# Patient Record
Sex: Female | Born: 1985 | Race: Black or African American | Hispanic: No | Marital: Single | Smoking: Current every day smoker
Health system: Southern US, Community
[De-identification: ages and names within clinical notes are randomized; demographics above are authoritative.]

---
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
## 2008-06-13 ENCOUNTER — Inpatient Hospital Stay (HOSPITAL_COMMUNITY): Admission: AD | Admit: 2008-06-13 | Discharge: 2008-06-13 | Payer: Self-pay | Admitting: Family Medicine

## 2010-02-03 ENCOUNTER — Emergency Department (HOSPITAL_COMMUNITY): Admission: EM | Admit: 2010-02-03 | Discharge: 2010-02-03 | Payer: Self-pay | Admitting: Emergency Medicine

## 2011-04-01 ENCOUNTER — Emergency Department (HOSPITAL_COMMUNITY)
Admission: EM | Admit: 2011-04-01 | Discharge: 2011-04-01 | Disposition: A | Discharge status: Home / Self Care | Payer: Self-pay | Attending: Emergency Medicine | Admitting: Emergency Medicine

## 2011-04-01 DIAGNOSIS — R079 Chest pain, unspecified: Secondary | ICD-10-CM | POA: Insufficient documentation

## 2011-04-01 DIAGNOSIS — R071 Chest pain on breathing: Secondary | ICD-10-CM | POA: Insufficient documentation

## 2013-10-10 ENCOUNTER — Emergency Department (HOSPITAL_COMMUNITY): Payer: BC Managed Care – PPO

## 2013-10-10 ENCOUNTER — Encounter (HOSPITAL_COMMUNITY): Payer: Self-pay | Admitting: Emergency Medicine

## 2013-10-10 ENCOUNTER — Emergency Department (HOSPITAL_COMMUNITY)
Admission: EM | Admit: 2013-10-10 | Discharge: 2013-10-10 | Disposition: A | Discharge status: Home / Self Care | Payer: BC Managed Care – PPO | Attending: Emergency Medicine | Admitting: Emergency Medicine

## 2013-10-10 DIAGNOSIS — Y929 Unspecified place or not applicable: Secondary | ICD-10-CM | POA: Insufficient documentation

## 2013-10-10 DIAGNOSIS — Y9389 Activity, other specified: Secondary | ICD-10-CM | POA: Insufficient documentation

## 2013-10-10 DIAGNOSIS — S93409A Sprain of unspecified ligament of unspecified ankle, initial encounter: Secondary | ICD-10-CM | POA: Diagnosis not present

## 2013-10-10 DIAGNOSIS — S8990XA Unspecified injury of unspecified lower leg, initial encounter: Secondary | ICD-10-CM | POA: Diagnosis present

## 2013-10-10 DIAGNOSIS — X500XXA Overexertion from strenuous movement or load, initial encounter: Secondary | ICD-10-CM | POA: Diagnosis not present

## 2013-10-10 DIAGNOSIS — S93609A Unspecified sprain of unspecified foot, initial encounter: Secondary | ICD-10-CM | POA: Insufficient documentation

## 2013-10-10 DIAGNOSIS — S93601A Unspecified sprain of right foot, initial encounter: Secondary | ICD-10-CM

## 2013-10-10 DIAGNOSIS — F17200 Nicotine dependence, unspecified, uncomplicated: Secondary | ICD-10-CM | POA: Insufficient documentation

## 2013-10-10 MED ORDER — NAPROXEN 500 MG PO TABS: 500.0000 mg | ORAL_TABLET | Freq: Two times a day (BID) | ORAL | Status: AC

## 2013-10-10 MED ORDER — HYDROCODONE-ACETAMINOPHEN 5-325 MG PO TABS
1.0000 | ORAL_TABLET | Freq: Once | ORAL | Status: AC
  Administered 2013-10-10: 1 via ORAL
  Filled 2013-10-10: qty 1

## 2013-10-10 NOTE — ED Notes (Signed)
Pt reports missed a step last night and turned r ankle.  C/O pain to r ankle.

## 2013-10-10 NOTE — ED Provider Notes (Signed)
CSN: [id number]     Arrival date & time 10/10/13  1222 History   First MD Initiated Contact with Patient 10/10/13 1230     Chief Complaint  Patient presents with  . Ankle Pain   (Consider location/radiation/quality/duration/timing/severity/associated sxs/prior Treatment) Patient is a 27 y.o. female presenting with ankle pain. The history is provided by the patient.  Ankle Pain Location:  Ankle Time since incident:  12 hours Injury: yes   Mechanism of injury: fall   Fall:    Fall occurred:  Down stairs   Impact surface:  Stairs Ankle location:  R ankle Pain details:    Quality:  Shooting and aching Associated symptoms: decreased ROM and swelling    Anita Shaffer is a 27 y.o. female who presents to the ED with right ankle pain. She was going down the steps last night and missed a step and turned her right ankle. Complains of pain and some swelling lateral aspect.   History reviewed. No pertinent past medical history. History reviewed. No pertinent past surgical history. No family history on file. History  Substance Use Topics  . Smoking status: Current Every Day Smoker  . Smokeless tobacco: Not on file  . Alcohol Use: No   OB History   Grav Para Term Preterm Abortions TAB SAB Ect Mult Living                 Review of Systems Negative except as stated in HPI  Allergies  Review of patient's allergies indicates no known allergies.  Home Medications  No current outpatient prescriptions on file. BP 136/88  Pulse 89  Temp(Src) 98.8 F (37.1 C) (Oral)  Resp 18  Ht 5\' 6"  (1.676 m)  Wt 190 lb (86.183 kg)  BMI 30.68 kg/m2  SpO2 100%  LMP 09/13/2013 Physical Exam  Nursing note and vitals reviewed. Constitutional: She is oriented to person, place, and time. She appears well-developed and well-nourished. No distress.  HENT:  Head: Normocephalic.  Eyes: Conjunctivae and EOM are normal.  Neck: Neck supple.  Cardiovascular: Normal rate.   Pulmonary/Chest: Effort  normal.  Musculoskeletal:       Right ankle: She exhibits normal range of motion, no deformity, no laceration and normal pulse. Swelling: minimal. Tenderness. Lateral malleolus tenderness found. Achilles tendon normal.       Feet:  Pedal pulse strong, adequate circulation, good touch sensation. Pain radiates for the ankle to the lateral aspect of the foot.   Neurological: She is alert and oriented to person, place, and time. No cranial nerve deficit.  Skin: Skin is warm and dry.  Psychiatric: She has a normal mood and affect. Her behavior is normal.   Dg Ankle Complete Right  10/10/2013   CLINICAL DATA:  Fall last night.  Right ankle pain.  EXAM: RIGHT ANKLE - COMPLETE 3+ VIEW  COMPARISON:  None.  FINDINGS: There is no evidence of fracture, dislocation, or joint effusion. There is no evidence of arthropathy or other focal bone abnormality. Soft tissues are unremarkable.  IMPRESSION: Negative.   Electronically Signed   By: Amie Portland M.D.   On: 10/10/2013 12:57   Dg Foot Complete Right  10/10/2013   CLINICAL DATA:  Proximal foot pain.  EXAM: RIGHT FOOT COMPLETE - 3+ VIEW  COMPARISON:  None.  FINDINGS: There is no evidence of fracture or dislocation. There is no evidence of arthropathy or other focal bone abnormality. Soft tissues are unremarkable.  IMPRESSION: No acute osseous injury of the right foot.  Electronically Signed   By: Elige Ko   On: 10/10/2013 12:57    ED Course  Procedures  MDM  27 y.o. female with sprain to the right foot and ankle. Stable for discharge. She remains neurovascularly intact.  Will place in [facility] and she will follow up as needed.  Discussed with the patient and all questioned fully answered.    Medication List         naproxen 500 MG tablet  Commonly known as:  NAPROSYN  Take 1 tablet (500 mg total) by mouth 2 (two) times daily with a meal.          Janne Napoleon, NP 10/10/13 1648

## 2013-10-10 NOTE — ED Notes (Signed)
Pt c/o right ankle pain and swelling since last night. Pt states she rolled her ankle while walking down steps. Limited movement due to pain.

## 2013-10-11 NOTE — ED Provider Notes (Signed)
Medical screening examination/treatment/procedure(s) were performed by non-physician practitioner and as supervising physician I was immediately available for consultation/collaboration.  EKG Interpretation   None         Nagi Furio W Cameka Rae, MD 10/11/13 0712 

## 2015-01-26 IMAGING — CR DG FOOT COMPLETE 3+V*R*
3 series · 3 of 3 positions shown · non-contrast
Comparison: None.

CLINICAL DATA: Proximal foot pain.

EXAM:
RIGHT FOOT COMPLETE - 3+ VIEW

[view not recorded (1 of 3)]
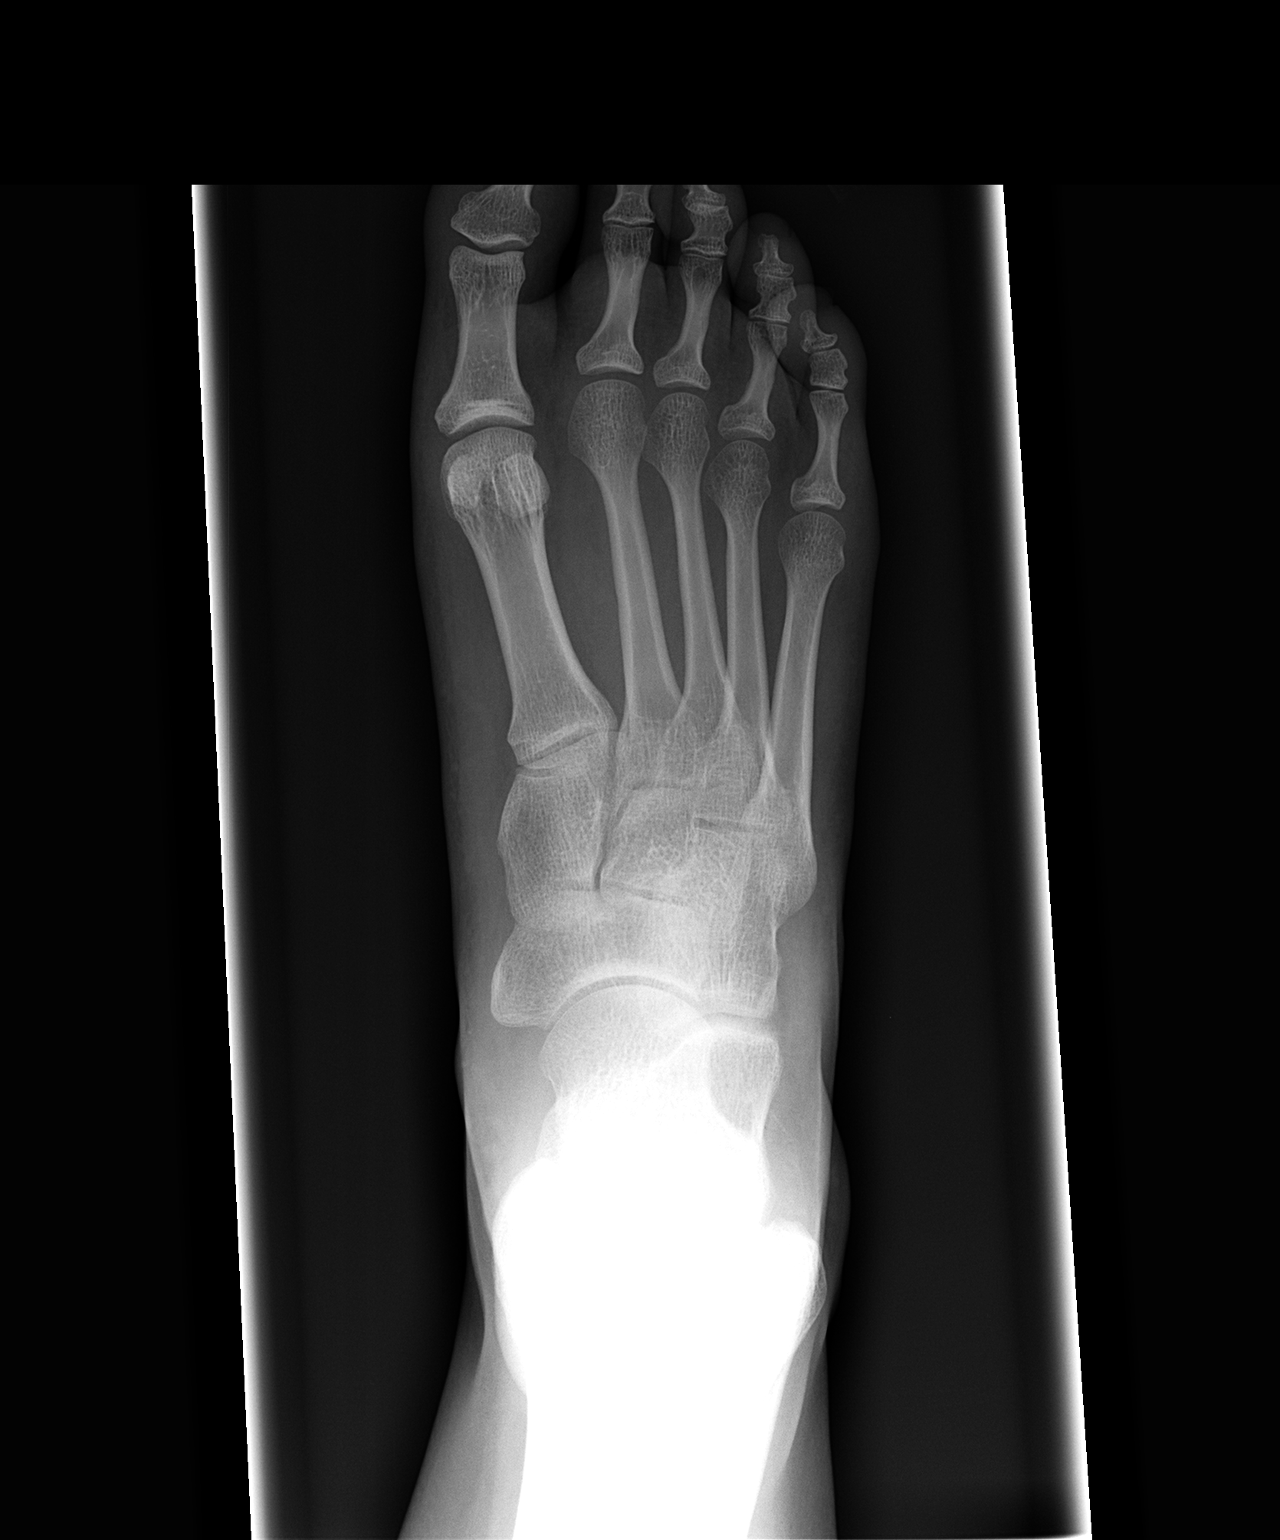

[view not recorded (2 of 3)]
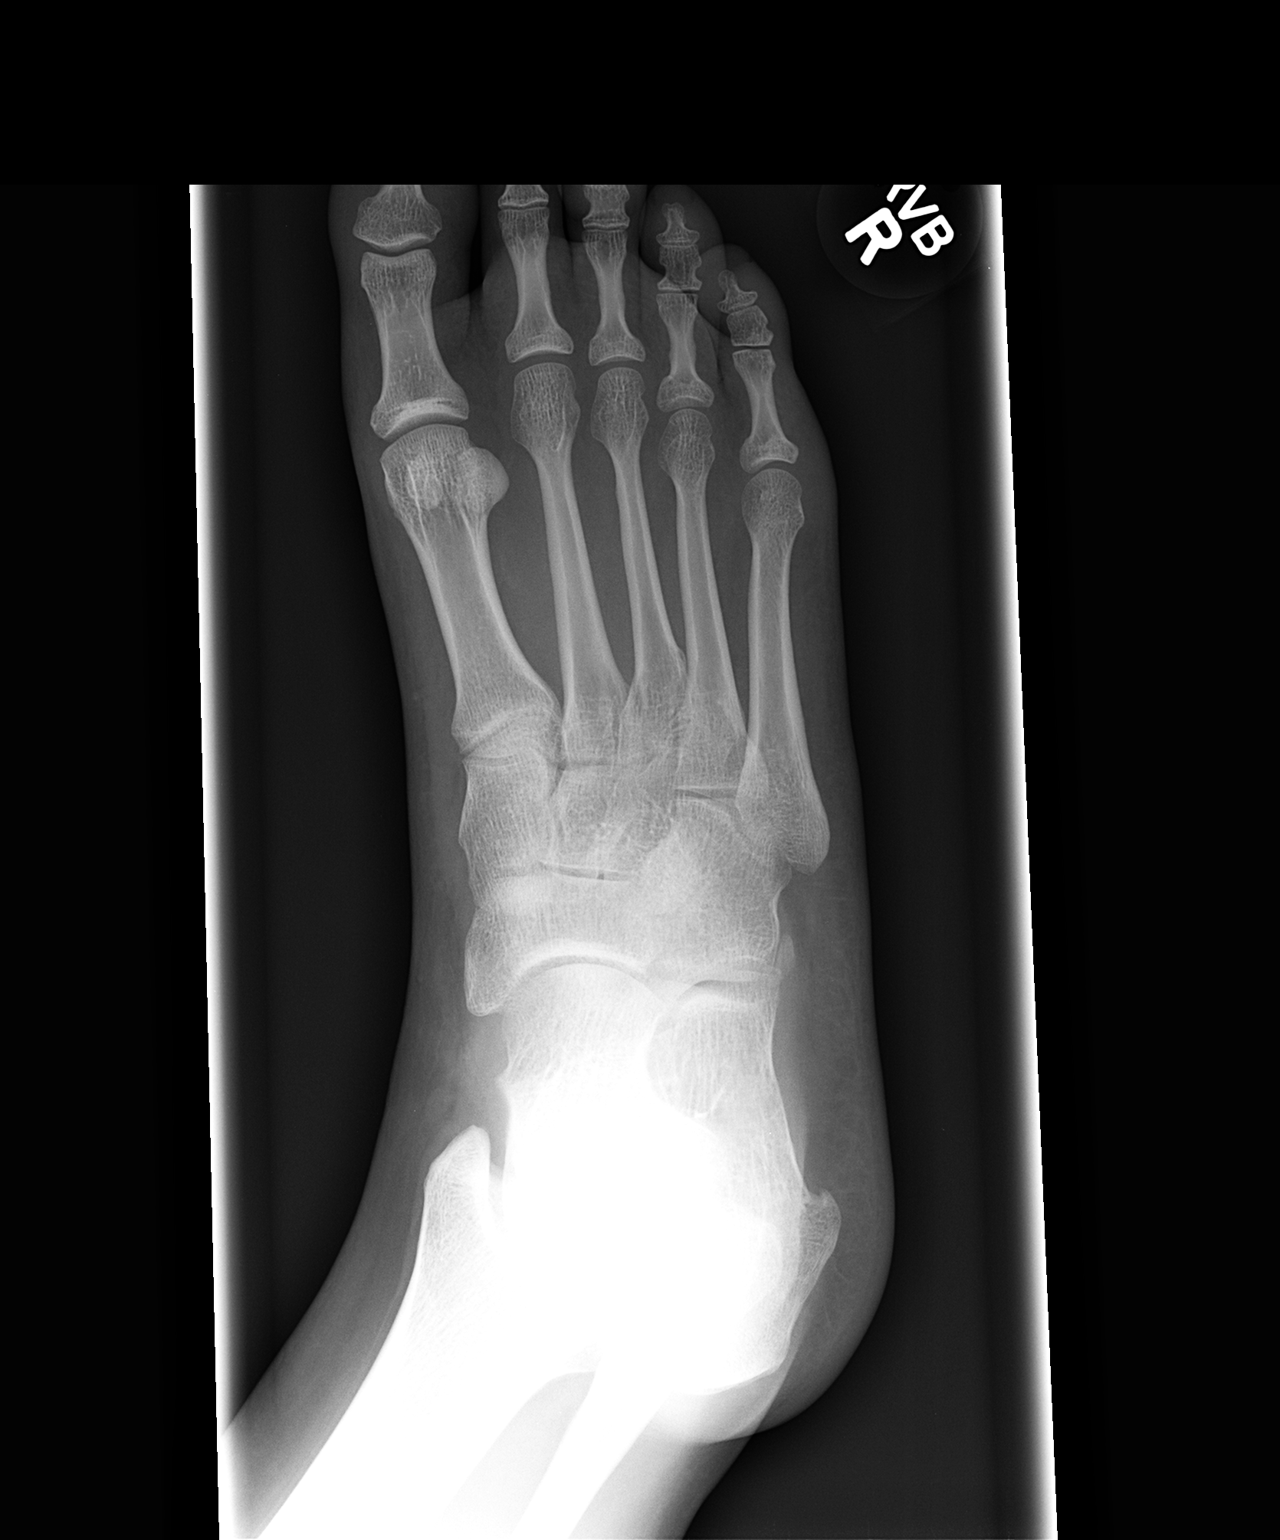

[view not recorded (3 of 3)]
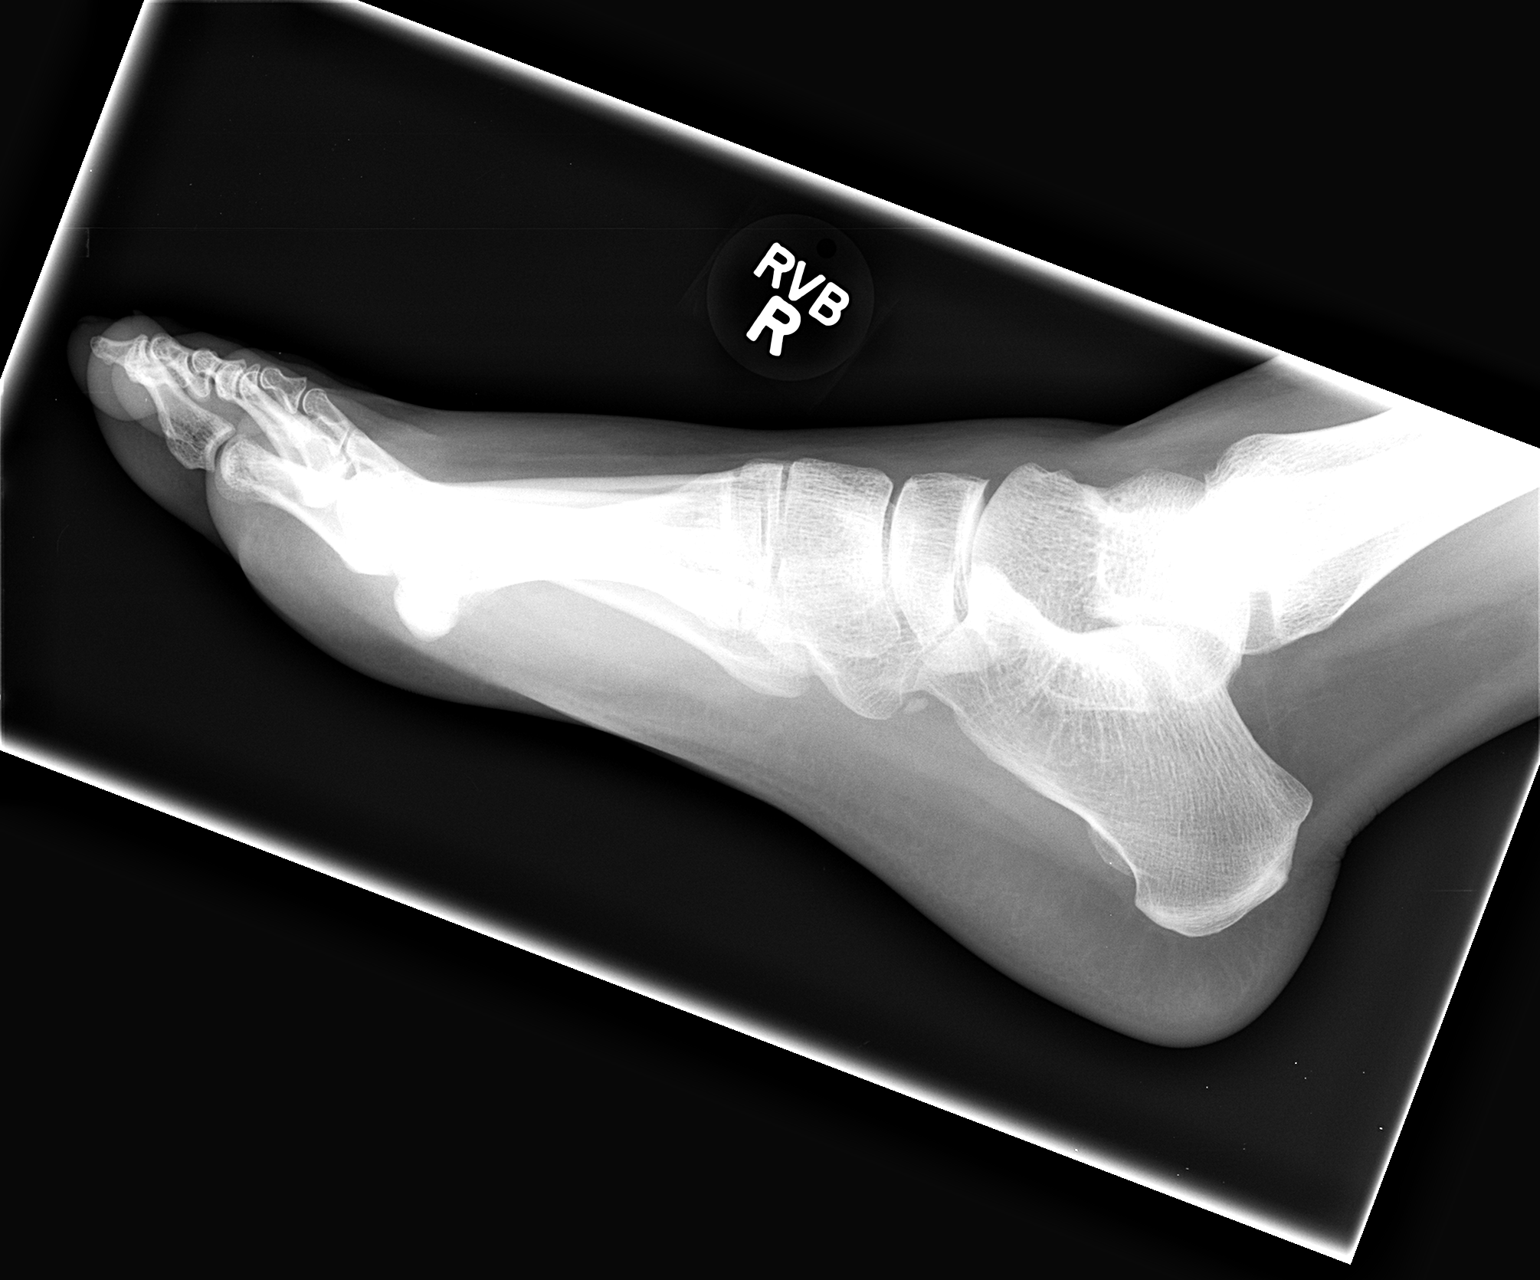

[3 of 3 positions shown; findings below may reference images not displayed]

FINDINGS: There is no evidence of fracture or dislocation. There is no
evidence of arthropathy or other focal bone abnormality. Soft
tissues are unremarkable.
IMPRESSION: No acute osseous injury of the right foot.

## 2015-11-18 ENCOUNTER — Emergency Department (HOSPITAL_COMMUNITY)
Admission: EM | Admit: 2015-11-18 | Discharge: 2015-11-18 | Disposition: A | Discharge status: Home / Self Care | Payer: BLUE CROSS/BLUE SHIELD | Attending: Emergency Medicine | Admitting: Emergency Medicine

## 2015-11-18 ENCOUNTER — Encounter (HOSPITAL_COMMUNITY): Payer: Self-pay | Admitting: Emergency Medicine

## 2015-11-18 ENCOUNTER — Emergency Department (HOSPITAL_COMMUNITY): Payer: BLUE CROSS/BLUE SHIELD

## 2015-11-18 DIAGNOSIS — R251 Tremor, unspecified: Secondary | ICD-10-CM | POA: Insufficient documentation

## 2015-11-18 DIAGNOSIS — R42 Dizziness and giddiness: Secondary | ICD-10-CM | POA: Diagnosis not present

## 2015-11-18 DIAGNOSIS — F17200 Nicotine dependence, unspecified, uncomplicated: Secondary | ICD-10-CM | POA: Insufficient documentation

## 2015-11-18 DIAGNOSIS — R0789 Other chest pain: Secondary | ICD-10-CM | POA: Diagnosis not present

## 2015-11-18 DIAGNOSIS — R531 Weakness: Secondary | ICD-10-CM | POA: Diagnosis not present

## 2015-11-18 DIAGNOSIS — Z791 Long term (current) use of non-steroidal anti-inflammatories (NSAID): Secondary | ICD-10-CM | POA: Diagnosis not present

## 2015-11-18 DIAGNOSIS — R55 Syncope and collapse: Secondary | ICD-10-CM | POA: Insufficient documentation

## 2015-11-18 DIAGNOSIS — R51 Headache: Secondary | ICD-10-CM | POA: Diagnosis not present

## 2015-11-18 LAB — I-STAT BETA HCG BLOOD, ED (MC, WL, AP ONLY): I-stat hCG, quantitative: <5 m[IU]/mL (ref <5)

## 2015-11-18 LAB — BASIC METABOLIC PANEL
Anion gap: 9 (ref 5–15)
BUN: 9 mg/dL (ref 6–20)
CO2: 23 mmol/L (ref 22–32)
CREATININE: 0.96 mg/dL (ref 0.44–1.00)
Calcium: 9.7 mg/dL (ref 8.9–10.3)
Chloride: 107 mmol/L (ref 101–111)
GFR calc Af Amer: >60 mL/min (ref >60)
GFR calc non Af Amer: >60 mL/min (ref >60)
GLUCOSE: 99 mg/dL (ref 65–99)
Potassium: 4.1 mmol/L (ref 3.5–5.1)
Sodium: 139 mmol/L (ref 135–145)

## 2015-11-18 LAB — URINALYSIS, ROUTINE W REFLEX MICROSCOPIC
BILIRUBIN URINE: NEGATIVE
GLUCOSE, UA: NEGATIVE mg/dL
Hgb urine dipstick: NEGATIVE
KETONES UR: NEGATIVE mg/dL
Nitrite: NEGATIVE
Protein, ur: NEGATIVE mg/dL
Specific Gravity, Urine: 1.028 (ref 1.005–1.030)
pH: 5.5 (ref 5.0–8.0)

## 2015-11-18 LAB — CBC
HCT: 44.1 % (ref 36.0–46.0)
Hemoglobin: 14.8 g/dL (ref 12.0–15.0)
MCH: 31 pg (ref 26.0–34.0)
MCHC: 33.6 g/dL (ref 30.0–36.0)
MCV: 92.5 fL (ref 78.0–100.0)
Platelets: 212 10*3/uL (ref 150–400)
RBC: 4.77 MIL/uL (ref 3.87–5.11)
RDW: 12.4 % (ref 11.5–15.5)
WBC: 11.2 10*3/uL — ABNORMAL HIGH (ref 4.0–10.5)

## 2015-11-18 LAB — URINE MICROSCOPIC-ADD ON: RBC / HPF: NONE SEEN RBC/hpf (ref 0–5)

## 2015-11-18 NOTE — Discharge Instructions (Signed)
You may take Ibuprofen and/or Tylenol as prescribed over the counter for pain relief. Follow up with your primary care provider in the next 4 days. Please return to the Emergency Department if symptoms worsen or new onset of fever, headache, visual changes, lightheadedness, dizziness, chest pain, difficulty breathing, abdominal pain, vomiting, blood in urine or stool, numbness, tingling, weakness, syncope or seizure.

## 2015-11-18 NOTE — ED Provider Notes (Signed)
CSN: [id number]     Arrival date & time 11/18/15  1226 History   First MD Initiated Contact with Patient 11/18/15 1754     Chief Complaint  Patient presents with  . Near Syncope     (Consider location/radiation/quality/duration/timing/severity/associated sxs/prior Treatment) HPI   Patient is a 29 year old female with no past medical history who presents to the ED with complaint  Of light headedness, onset 11 AM. Patient reports she was studying when she started to feel lightheaded with associated "shakiness", weakness and dizziness that she describes as "uneasiness".  Patient reports she started driving home but then states her symptoms worsen resulting and her driving to the ED. She reports symptoms resolved spontaneously proximally 2 hours after onset. She reports having mild left-sided chest tightness after the initial symptoms resolved but states her chest tightness has also resolved.  She also reports having a similar episode on Monday and notes that during that episode she had associated intermittent sharp headache located to her temporal regions that lasted for approximately 1 hour and then resolved on its own. Denies any aggravating or alleviating factors. Denies fever, chills, visual changes, cough, SOB, wheezing, palpitations, abdominal pain, N/V/D, urinary sxs, numbness, tingling, dysphagia, facial droop. Pt reports she was seen by a Cardiologist this summer regarding palpitations and notes she had an unremarkable echo and 30-day heart monitor. Pt also endorses having recent increased stress over the past few weeks.    History reviewed. No pertinent past medical history. History reviewed. No pertinent past surgical history. No family history on file. Social History  Substance Use Topics  . Smoking status: Current Every Day Smoker  . Smokeless tobacco: None  . Alcohol Use: No   OB History    No data available     Review of Systems  Respiratory: Positive for chest tightness.    Neurological: Positive for dizziness, weakness, light-headedness and headaches.  All other systems reviewed and are negative.     Allergies  Review of patient's allergies indicates no known allergies.  Home Medications   Prior to Admission medications   Medication Sig Start Date End Date Taking? Authorizing Provider  naproxen (NAPROSYN) 500 MG tablet Take 1 tablet (500 mg total) by mouth 2 (two) times daily with a meal. 10/10/13   Hope Orlene Och, NP   BP 109/71 mmHg  Pulse 82  Temp(Src) 98.2 F (36.8 C) (Oral)  Resp 20  Ht  (1.676 m)  Wt 81.647 kg  BMI 29.07 kg/m2  SpO2 100%  LMP 10/27/2015 Physical Exam  Constitutional: She is oriented to person, place, and time. She appears well-developed and well-nourished. No distress.  HENT:  Head: Normocephalic and atraumatic.  Mouth/Throat: Oropharynx is clear and moist. No oropharyngeal exudate.  Eyes: Conjunctivae and EOM are normal. Pupils are equal, round, and reactive to light. Right eye exhibits no discharge. Left eye exhibits no discharge. No scleral icterus.  Neck: Normal range of motion. Neck supple.  Cardiovascular: Normal rate, regular rhythm, normal heart sounds and intact distal pulses.   No murmur heard. Pulmonary/Chest: Effort normal and breath sounds normal. No respiratory distress. She has no wheezes. She has no rales. She exhibits no tenderness.  Abdominal: Soft. Bowel sounds are normal. She exhibits no distension and no mass. There is no tenderness. There is no rebound and no guarding.  Musculoskeletal: Normal range of motion. She exhibits no edema or tenderness.  Lymphadenopathy:    She has no cervical adenopathy.  Neurological: She is alert and oriented to person,  place, and time. She has normal strength and normal reflexes. No cranial nerve deficit or sensory deficit. Coordination and gait normal.  Skin: Skin is warm and dry. She is not diaphoretic.  Nursing note and vitals reviewed.   ED Course   Procedures (including critical care time) Labs Review Labs Reviewed  CBC - Abnormal; Notable for the following:    WBC 11.2 (*)    All other components within normal limits  URINALYSIS, ROUTINE W REFLEX MICROSCOPIC (NOT AT [facility]) - Abnormal; Notable for the following:    Leukocytes, UA MODERATE (*)    All other components within normal limits  URINE MICROSCOPIC-ADD ON - Abnormal; Notable for the following:    Squamous Epithelial / LPF 6-30 (*)    Bacteria, UA RARE (*)    All other components within normal limits  BASIC METABOLIC PANEL  CBG MONITORING, ED  I-STAT BETA HCG BLOOD, ED ([facility], [facility], [facility] ONLY)    Imaging Review Ct Head Wo Contrast  11/18/2015  CLINICAL DATA:  Right-sided headache on Monday. Left-sided headache today. Lightheaded. EXAM: CT HEAD WITHOUT CONTRAST TECHNIQUE: Contiguous axial images were obtained from the base of the skull through the vertex without intravenous contrast. COMPARISON:  None. FINDINGS: No acute cortical infarct, hemorrhage, or mass lesion is present. The ventricles are of normal size. No significant extra-axial fluid collection is evident. The paranasal sinuses and mastoid air cells are clear. The calvarium is intact. The globes and orbits are intact. No focal extracranial soft tissue lesion is evident. IMPRESSION: Negative CT of the head. Electronically Signed   By: Marin Robertshristopher  Mattern M.D.   On: 11/18/2015 20:28   I have personally reviewed and evaluated these images and lab results as part of my medical decision-making.    MDM   Final diagnoses:  Near syncope    Pt presents with episode of lightheadedness/dizziness, chest tightness, headache and weakness that occurred PTA. Endorses similar episode earlier this week. Reports recent increase in stress. Upon arrival to the ED pt reports her sxs completely resolved. VSS. Exam unremarkable. EKG showed NSR. Pregnancy negative. Labs unremarkable. Orthostatics negative. CT head negative. I have a low  suspicion for ACS, PE, dissection, or other acute cardiac event at this time. I suspect pt's sxs may likely be due to either anxiety or migraine. I do not suspect intracranial lesion, meningitis, encephalopathy or encephalitis. Plan to d/c pt home and advised pt to follow up with PCP.  Evaluation does not show pathology requring ongoing emergent intervention or admission. Pt is hemodynamically stable and mentating appropriately. Discussed findings/results and plan with patient/guardian, who agrees with plan. All questions answered. Return precautions discussed and outpatient follow up given.        Satira Sarkicole Elizabeth RossmoreNadeau, [place]-C 11/19/15 0101  Bethann BerkshireJoseph Zammit, MD 11/20/15 [phone]

## 2015-11-18 NOTE — ED Notes (Signed)
Pt c/o feeling like she was going to pass out. Pt reports same symptoms happened Monday. Pt speech clear, no facial droop, tongue midline, equal grips.

## 2017-03-05 IMAGING — CT CT HEAD W/O CM
2 series · 15 of 30 positions shown, 17 images · non-contrast
Comparison: None.

CLINICAL DATA: Right-sided headache on [REDACTED]. Left-sided headache
today. Lightheaded.

EXAM:
CT HEAD WITHOUT CONTRAST
TECHNIQUE: Contiguous axial images were obtained from the base of the skull
through the vertex without intravenous contrast.

[Series 2: head without · axial · non-contrast · 0.40mm/px · z∈[-144,-29]mm · 7 of 31 slices shown, 9 images]
[im 4/31  brain]
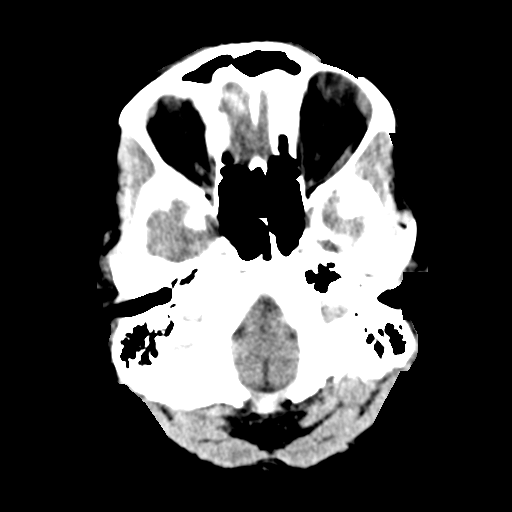
[im 4/31  bone]
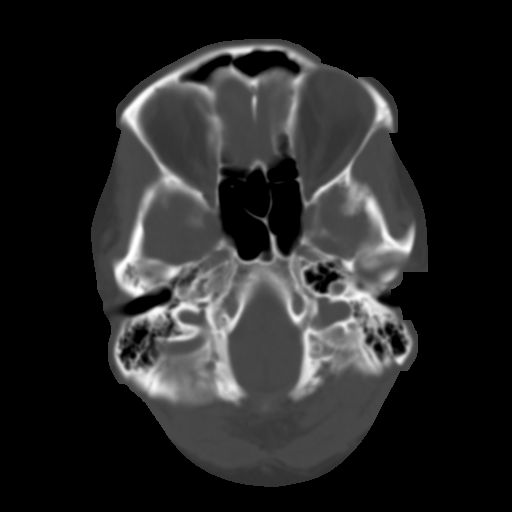
[im 8/31  brain]
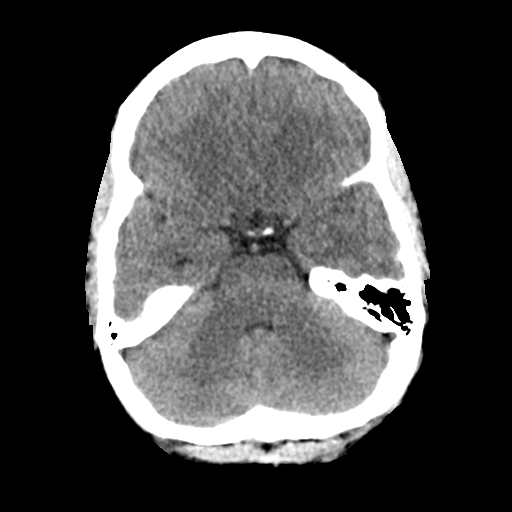
[im 12/31  brain]
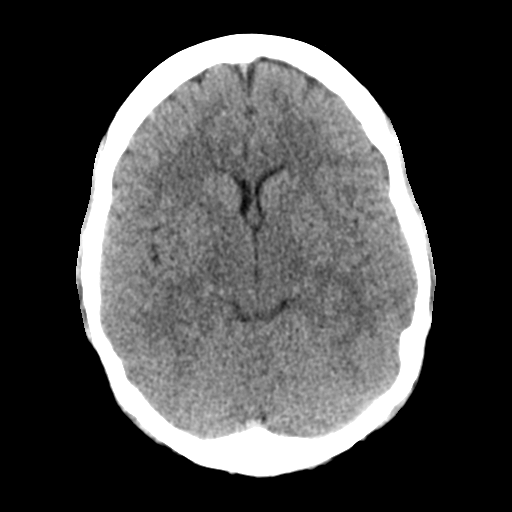
[im 16/31  brain]
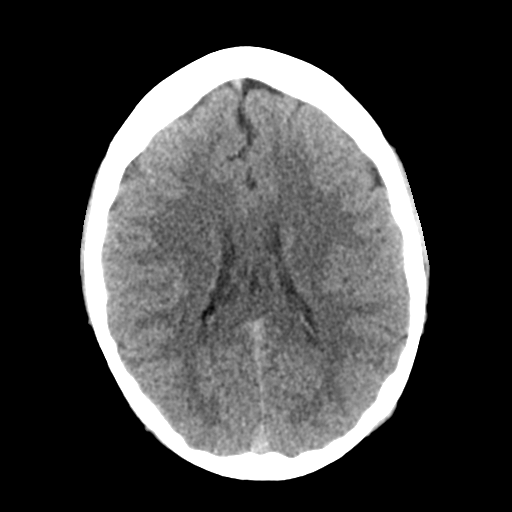
[im 19/31  brain]
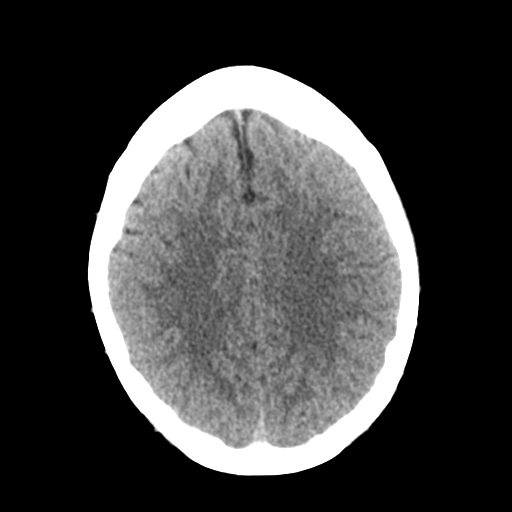
[im 19/31  bone]
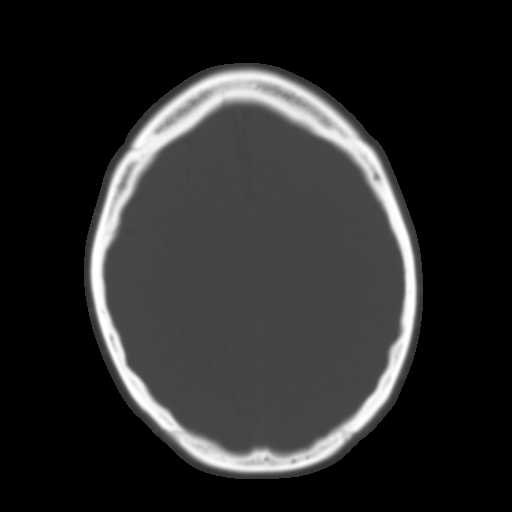
[im 23/31  brain]
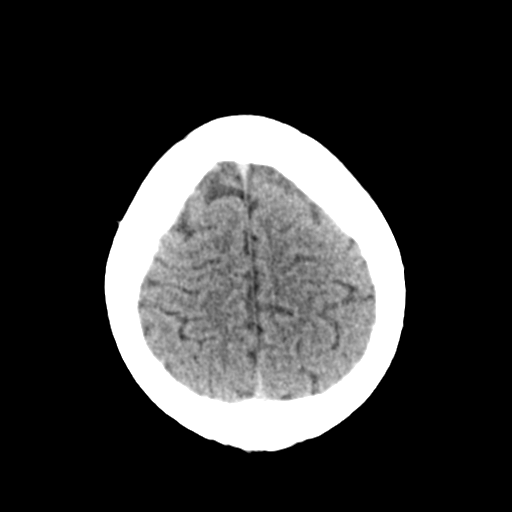
[im 27/31  brain]
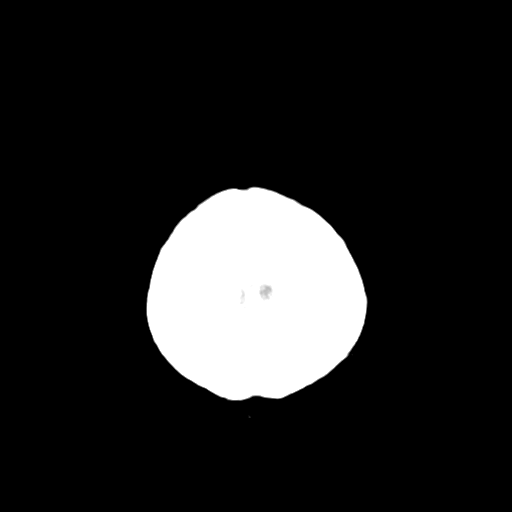

[Series 3: head bone · axial · 0.40mm/px · z∈[-145,-25]mm · 8 of 76 slices shown]
[im 8/76  bone]
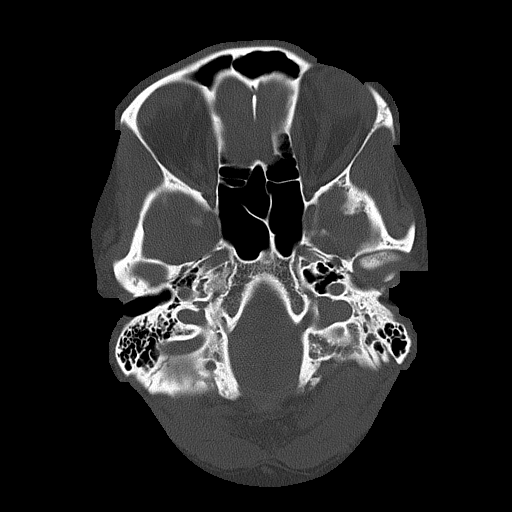
[im 16/76  bone]
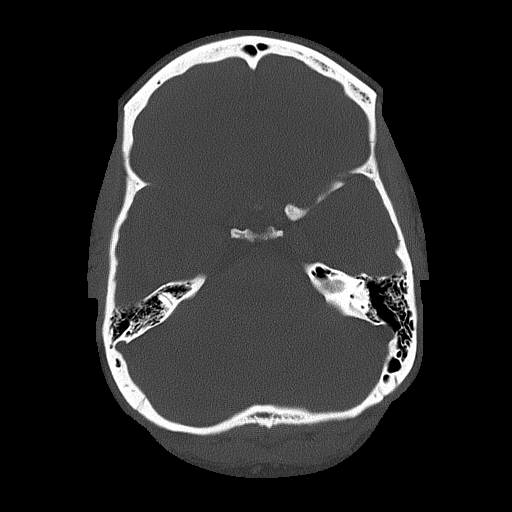
[im 23/76  bone]
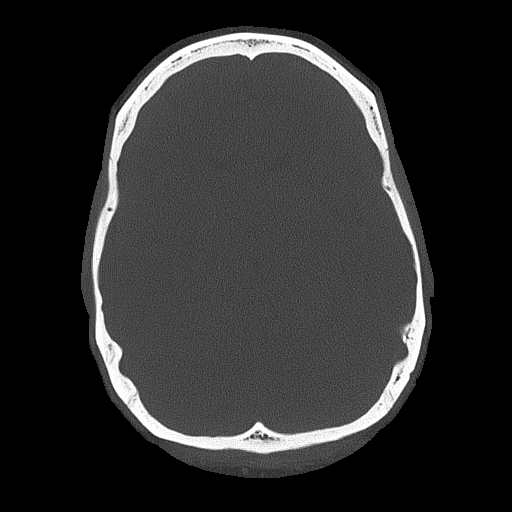
[im 34/76  bone]
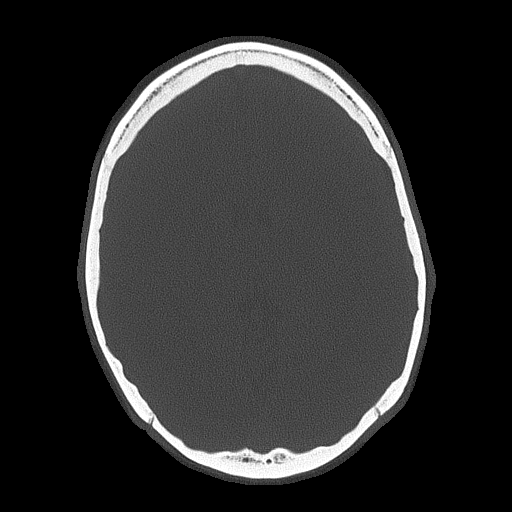
[im 42/76  bone]
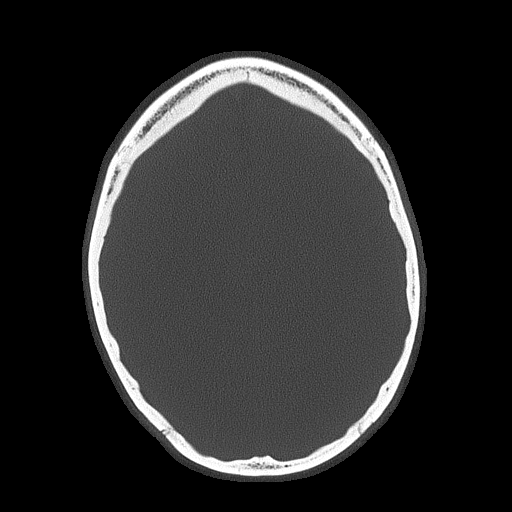
[im 53/76  bone]
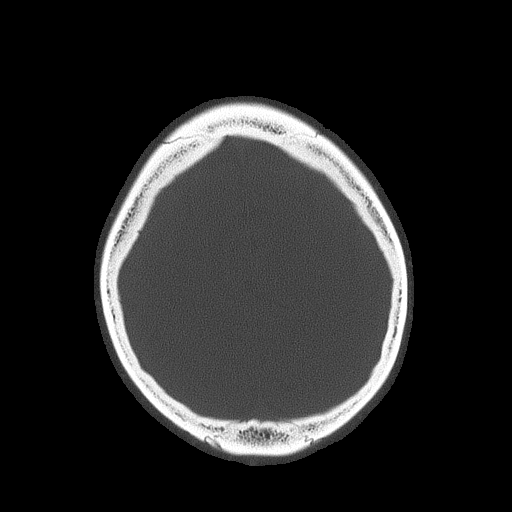
[im 61/76  bone]
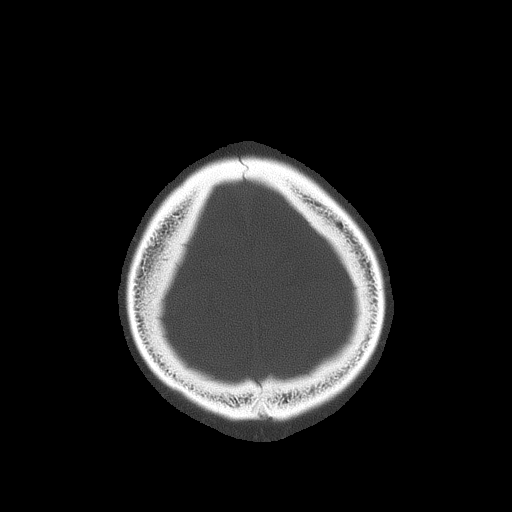
[im 68/76  bone]
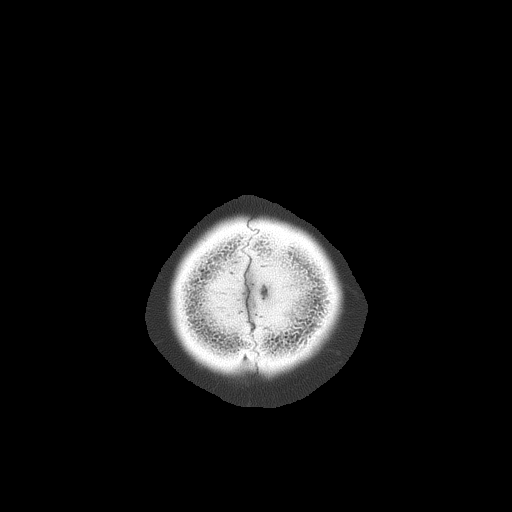

[15 of 30 positions shown; findings below may reference images not displayed]

FINDINGS: No acute cortical infarct, hemorrhage, or mass lesion is present.
The ventricles are of normal size. No significant extra-axial fluid
collection is evident. The paranasal sinuses and mastoid air cells
are clear. The calvarium is intact. The globes and orbits are
intact. No focal extracranial soft tissue lesion is evident.
IMPRESSION: Negative CT of the head.

## 2018-06-18 ENCOUNTER — Other Ambulatory Visit: Payer: Self-pay | Admitting: Nurse Practitioner

## 2018-06-18 DIAGNOSIS — N6310 Unspecified lump in the right breast, unspecified quadrant: Secondary | ICD-10-CM

## 2018-06-22 ENCOUNTER — Ambulatory Visit
Admission: RE | Admit: 2018-06-22 | Discharge: 2018-06-22 | Disposition: A | Discharge status: Home / Self Care | Payer: No Typology Code available for payment source | Source: Ambulatory Visit | Attending: Nurse Practitioner | Admitting: Nurse Practitioner

## 2018-06-22 ENCOUNTER — Ambulatory Visit
Admission: RE | Admit: 2018-06-22 | Discharge: 2018-06-22 | Disposition: A | Discharge status: Home / Self Care | Payer: BLUE CROSS/BLUE SHIELD | Source: Ambulatory Visit | Attending: Nurse Practitioner | Admitting: Nurse Practitioner

## 2018-06-22 DIAGNOSIS — N6310 Unspecified lump in the right breast, unspecified quadrant: Secondary | ICD-10-CM
